# Patient Record
Sex: Female | Born: 1980 | Race: Black or African American | Hispanic: No | Marital: Married | Smoking: Never smoker
Health system: Southern US, Community
[De-identification: ages and names within clinical notes are randomized; demographics above are authoritative.]

## PROBLEM LIST (undated history)

## (undated) HISTORY — PX: NO PAST SURGERIES: SHX2092

---

## 2019-08-04 ENCOUNTER — Other Ambulatory Visit: Payer: Self-pay

## 2019-08-04 ENCOUNTER — Encounter (HOSPITAL_COMMUNITY): Payer: Self-pay

## 2019-08-04 ENCOUNTER — Ambulatory Visit (HOSPITAL_COMMUNITY)
Admission: EM | Admit: 2019-08-04 | Discharge: 2019-08-04 | Disposition: A | Discharge status: Home / Self Care | Payer: PRIVATE HEALTH INSURANCE | Attending: Family Medicine | Admitting: Family Medicine

## 2019-08-04 DIAGNOSIS — N912 Amenorrhea, unspecified: Secondary | ICD-10-CM | POA: Diagnosis not present

## 2019-08-04 DIAGNOSIS — Z3202 Encounter for pregnancy test, result negative: Secondary | ICD-10-CM | POA: Diagnosis not present

## 2019-08-04 LAB — POCT PREGNANCY, URINE: Preg Test, Ur: NEGATIVE

## 2019-08-04 NOTE — ED Provider Notes (Signed)
[facility]    CSN: [id number] Arrival date & time: 08/04/19  1122      History   Chief Complaint Chief Complaint  Patient presents with  . Possible Pregnancy    HPI Crystal Hardy is a 38 y.o. female.   Crystal Hardy presents with complaints of no period since April 06 2019. She has tested for pregnancy which has been negative. She states she has occasionally missed a month or two of her period but has never gone this long. She is not on birth control. Denies any previous pregnancies. States she has not recently been sexually active, she is concerned about pregnancy, however. No abdominal pain, no back pain, no nausea or vomiting.     ROS per HPI, negative if not otherwise mentioned.      History reviewed. No pertinent past medical history.  There are no active problems to display for this patient.   History reviewed. No pertinent surgical history.  OB History   No obstetric history on file.      Home Medications    Prior to Admission medications   Not on File    Family History Family History  Problem Relation Age of Onset  . Healthy Mother   . Healthy Father     Social History Social History   Tobacco Use  . Smoking status: Never Smoker  . Smokeless tobacco: Never Used  Substance Use Topics  . Alcohol use: Never    Frequency: Never  . Drug use: Not on file     Allergies   Patient has no known allergies.   Review of Systems Review of Systems   Physical Exam Triage Vital Signs ED Triage Vitals [08/04/19 1245]  Enc Vitals Group     BP 123/84     Pulse Rate 71     Resp 16     Temp 97.7 F (36.5 C)     Temp Source Oral     SpO2 100 %     Weight      Height      Head Circumference      Peak Flow      Pain Score 0     Pain Loc      Pain Edu?      Excl. in GC?    No data found.  Updated Vital Signs BP 123/84 (BP Location: Right Arm)   Pulse 71   Temp 97.7 F (36.5 C) (Oral)   Resp 16   LMP 04/06/2019  (Exact Date)   SpO2 100%    Physical Exam Constitutional:      General: She is not in acute distress.    Appearance: She is well-developed.  Cardiovascular:     Rate and Rhythm: Normal rate.  Pulmonary:     Effort: Pulmonary effort is normal.  Abdominal:     Tenderness: There is no abdominal tenderness.  Skin:    General: Skin is warm and dry.  Neurological:     Mental Status: She is alert and oriented to person, place, and time.      UC Treatments / Results  Labs (all labs ordered are listed, but only abnormal results are displayed) Labs Reviewed  POC URINE PREG, ED  POCT PREGNANCY, URINE    EKG   Radiology No results found.  Procedures Procedures (including critical care time)  Medications Ordered in UC Medications - No data to display  Initial Impression / Assessment and Plan / UC Course  I have reviewed the  triage vital signs and the nursing notes.  Pertinent labs & imaging results that were available during my care of the patient were reviewed by me and considered in my medical decision making (see chart for details).     Negative pregnancy test by urine here today as well. Otherwise no symptoms. Recommended gynecology follow up. Patient verbalized understanding and agreeable to plan.   Final Clinical Impressions(s) / UC Diagnoses   Final diagnoses:  Amenorrhea  Negative pregnancy test     Discharge Instructions     Negative for pregnancy here today as well.  I would like you to follow up with gynecology for further management and evaluation of your lack of periods.    ED Prescriptions    None     PDMP not reviewed this encounter.   Zigmund Gottron, NP 08/04/19 1320

## 2019-08-04 NOTE — ED Triage Notes (Signed)
Patient presents to Urgent Care with complaints of missing period since June 28. Patient reports she has taken pregnancy tests at home and they have been negative, pt denies implant or IUD.

## 2019-08-04 NOTE — Discharge Instructions (Signed)
Negative for pregnancy here today as well.  I would like you to follow up with gynecology for further management and evaluation of your lack of periods.

## 2019-08-25 ENCOUNTER — Telehealth: Payer: Self-pay | Admitting: Student

## 2019-08-25 NOTE — Telephone Encounter (Signed)
Spoke to patient about her appointment on 11/17 @ 9:15. Patient instructed to wear a face mask for the entire appointment and no visitors are allowed with her during the visit. Patient screened for covid symptoms and denied having any

## 2019-08-26 ENCOUNTER — Encounter: Payer: PRIVATE HEALTH INSURANCE | Admitting: Student

## 2019-09-08 ENCOUNTER — Ambulatory Visit: Payer: PRIVATE HEALTH INSURANCE | Admitting: Advanced Practice Midwife

## 2019-09-08 ENCOUNTER — Other Ambulatory Visit: Payer: Self-pay

## 2019-09-08 ENCOUNTER — Encounter: Payer: Self-pay | Admitting: Advanced Practice Midwife

## 2019-09-08 VITALS — BP 124/86 | HR 69 | Wt 176.1 lb

## 2019-09-08 DIAGNOSIS — N912 Amenorrhea, unspecified: Secondary | ICD-10-CM

## 2019-09-08 NOTE — Progress Notes (Signed)
Patient was screened prior to today's visit then  reported cough and sinus congestion when she was roomed for today's visit. She states her husband has the same symptoms, no COVID tests in work. COVID screening policy was reviewed with patient and she was asked to present to [facility] for testing as well as reschedule her appointment. Patient verbalized understanding.  Mallie Snooks, MSN, CNM Certified Nurse Midwife, [facility] for [employer], [place] 09/08/19 10:39 AM

## 2019-09-08 NOTE — Patient Instructions (Signed)
COVID-19: How to Protect Yourself and Others Know how it spreads  There is currently no vaccine to prevent coronavirus disease 2019 (COVID-19).  The best way to prevent illness is to avoid being exposed to this virus.  The virus is thought to spread mainly from person-to-person. ? Between people who are in close contact with one another (within about 6 feet). ? Through respiratory droplets produced when an infected person coughs, sneezes or talks. ? These droplets can land in the mouths or noses of people who are nearby or possibly be inhaled into the lungs. ? Some recent studies have suggested that COVID-19 may be spread by people who are not showing symptoms. Everyone should Clean your hands often  Wash your hands often with soap and water for at least 20 seconds especially after you have been in a public place, or after blowing your nose, coughing, or sneezing.  If soap and water are not readily available, use a hand sanitizer that contains at least 60% alcohol. Cover all surfaces of your hands and rub them together until they feel dry.  Avoid touching your eyes, nose, and mouth with unwashed hands. Avoid close contact  Stay home if you are sick.  Avoid close contact with people who are sick.  Put distance between yourself and other people. ? Remember that some people without symptoms may be able to spread virus. ? This is especially important for people who are at higher risk of getting very sick.www.cdc.gov/coronavirus/2019-ncov/need-extra-precautions/people-at-higher-risk.html Cover your mouth and nose with a cloth face cover when around others  You could spread COVID-19 to others even if you do not feel sick.  Everyone should wear a cloth face cover when they have to go out in public, for example to the grocery store or to pick up other necessities. ? Cloth face coverings should not be placed on young children under age 2, anyone who has trouble breathing, or is unconscious,  incapacitated or otherwise unable to remove the mask without assistance.  The cloth face cover is meant to protect other people in case you are infected.  Do NOT use a facemask meant for a healthcare worker.  Continue to keep about 6 feet between yourself and others. The cloth face cover is not a substitute for social distancing. Cover coughs and sneezes  If you are in a private setting and do not have on your cloth face covering, remember to always cover your mouth and nose with a tissue when you cough or sneeze or use the inside of your elbow.  Throw used tissues in the trash.  Immediately wash your hands with soap and water for at least 20 seconds. If soap and water are not readily available, clean your hands with a hand sanitizer that contains at least 60% alcohol. Clean and disinfect  Clean AND disinfect frequently touched surfaces daily. This includes tables, doorknobs, light switches, countertops, handles, desks, phones, keyboards, toilets, faucets, and sinks. www.cdc.gov/coronavirus/2019-ncov/prevent-getting-sick/disinfecting-your-home.html  If surfaces are dirty, clean them: Use detergent or soap and water prior to disinfection.  Then, use a household disinfectant. You can see a list of EPA-registered household disinfectants here. cdc.gov/coronavirus 02/11/2019 This information is not intended to replace advice given to you by your health care provider. Make sure you discuss any questions you have with your health care provider. Document Released: 01/21/2019 Document Revised: 02/19/2019 Document Reviewed: 01/21/2019 Elsevier Patient Education  2020 Elsevier Inc.  

## 2019-09-23 ENCOUNTER — Other Ambulatory Visit: Payer: Self-pay

## 2019-09-23 ENCOUNTER — Encounter: Payer: Self-pay | Admitting: Family Medicine

## 2019-09-23 ENCOUNTER — Ambulatory Visit (INDEPENDENT_AMBULATORY_CARE_PROVIDER_SITE_OTHER): Payer: PRIVATE HEALTH INSURANCE | Admitting: Family Medicine

## 2019-09-23 VITALS — BP 129/86 | HR 81 | Wt 181.0 lb

## 2019-09-23 DIAGNOSIS — Z113 Encounter for screening for infections with a predominantly sexual mode of transmission: Secondary | ICD-10-CM

## 2019-09-23 DIAGNOSIS — Z3169 Encounter for other general counseling and advice on procreation: Secondary | ICD-10-CM | POA: Insufficient documentation

## 2019-09-23 DIAGNOSIS — Z124 Encounter for screening for malignant neoplasm of cervix: Secondary | ICD-10-CM | POA: Diagnosis not present

## 2019-09-23 DIAGNOSIS — Z1151 Encounter for screening for human papillomavirus (HPV): Secondary | ICD-10-CM

## 2019-09-23 DIAGNOSIS — Z01419 Encounter for gynecological examination (general) (routine) without abnormal findings: Secondary | ICD-10-CM

## 2019-09-23 DIAGNOSIS — N911 Secondary amenorrhea: Secondary | ICD-10-CM

## 2019-09-23 MED ORDER — MEDROXYPROGESTERONE ACETATE 10 MG PO TABS: 10.0000 mg | ORAL_TABLET | Freq: Every day | ORAL | 3 refills | Status: DC

## 2019-09-23 NOTE — Progress Notes (Signed)
Subjective:    Patient ID: Crystal Hardy is a 37 y.o. female presenting with Gynecologic Exam  on 09/23/2019  HPI: Normal cycles until June--no cycle since end of Jun. Saw a spot on November.denies hot flashes, night sweats. No change in weight, cold intolerance or swelling in her feet. Has h/o skipping a cycle for one or two months only and ten it has come back.  Review of Systems  Constitutional: Negative for chills, fever and unexpected weight change.  HENT: Negative for congestion, nosebleeds and rhinorrhea.   Eyes: Negative for visual disturbance.  Respiratory: Negative for chest tightness and shortness of breath.   Cardiovascular: Negative for chest pain.  Gastrointestinal: Negative for abdominal distention, abdominal pain, blood in stool, constipation, diarrhea, nausea and vomiting.  Genitourinary: Negative for dyspareunia, dysuria, frequency, menstrual problem and vaginal bleeding.  Musculoskeletal: Negative for arthralgias and joint swelling.       No edema   Skin: Negative for rash.       No hair growth  Neurological: Negative for dizziness and headaches.  Psychiatric/Behavioral: Negative for behavioral problems, dysphoric mood and sleep disturbance.  All other systems reviewed and are negative.     Objective:    BP 129/86   Pulse 81   Wt 181 lb (82.1 kg)   LMP 04/05/2019 (Exact Date)  Physical Exam Vitals reviewed. Exam conducted with a chaperone present.  Constitutional:      Appearance: She is well-developed.  HENT:     Head: Normocephalic and atraumatic.  Eyes:     General: No scleral icterus.    Pupils: Pupils are equal, round, and reactive to light.  Neck:     Thyroid: No thyromegaly.  Cardiovascular:     Rate and Rhythm: Normal rate and regular rhythm.  Pulmonary:     Effort: Pulmonary effort is normal.     Breath sounds: Normal breath sounds.  Chest:     Breasts:        Right: Normal. No inverted nipple or mass.        Left: Normal. No  inverted nipple or mass.  Abdominal:     General: There is no distension.     Palpations: Abdomen is soft.     Tenderness: There is no abdominal tenderness.  Genitourinary:    Tanner stage (genital): 5.     Comments: BUS normal, vagina is pink and rugated, cervix is nulliparous without lesion, uterus is small and anteverted, no adnexal mass or tenderness.  Musculoskeletal:     Cervical back: Normal range of motion.  Skin:    General: Skin is warm and dry.  Neurological:     Mental Status: She is alert and oriented to person, place, and time.         Assessment & Plan:   Problem List Items Addressed This Visit      Unprioritized   Secondary amenorrhea    Check labs, and work up including u/s      Relevant Medications   medroxyPROGESTERone (PROVERA) 10 MG tablet   Other Relevant Orders   FSH   TSH   Prolactin   17-Hydroxyprogesterone   Testosterone levels*LC   US PELVIC COMPLETE WITH TRANSVAGINAL   Infertility counseling    May be related to her anovulation, but complete w/u to include HSG and semen analysis      Relevant Orders   Hysterosalpingogram (HSG) for infertility    Other Visit Diagnoses    Well woman exam    -  Primary  Relevant Orders   Cytology - PAP( [place])   CBC   Comprehensive metabolic panel   Hemoglobin A1c   Vitamin D (25 hydroxy)   Lipid panel   Screen for STD (sexually transmitted disease)       Relevant Orders   Hepatitis B surface antigen   Hepatitis C antibody   RPR   HIV antibody      Total face-to-face time with patient: 30 minutes. Over 50% of encounter was spent on counseling and coordination of care. Return in about 4 weeks (around 10/21/2019) for virtual, schedule HSG on day 5 of cycle-pt. to call when gets menses, sched pelvic sono, semen anal.  Crystal Hardy 09/23/2019 10:00 AM

## 2019-09-23 NOTE — Assessment & Plan Note (Signed)
May be related to her anovulation, but complete w/u to include HSG and semen analysis

## 2019-09-23 NOTE — Assessment & Plan Note (Signed)
Check labs, and work up including u/s

## 2019-09-23 NOTE — Progress Notes (Signed)
[facility] to inquire about semen analysis process- left message for staff number to call us back. Provided office info to patient.

## 2019-09-23 NOTE — Patient Instructions (Signed)

## 2019-09-24 LAB — CYTOLOGY - PAP
Chlamydia: NEGATIVE
Comment: NEGATIVE
Comment: NEGATIVE
Comment: NEGATIVE
Comment: NORMAL
Diagnosis: NEGATIVE
High risk HPV: NEGATIVE
Neisseria Gonorrhea: NEGATIVE
Trichomonas: NEGATIVE

## 2019-09-28 LAB — TSH: TSH: 2.08 u[IU]/mL (ref 0.450–4.500)

## 2019-09-28 LAB — LIPID PANEL
Chol/HDL Ratio: 2.7 ratio (ref 0.0–4.4)
Cholesterol, Total: 139 mg/dL (ref 100–199)
HDL: 52 mg/dL (ref >39)
LDL Chol Calc (NIH): 74 mg/dL (ref 0–99)
Triglycerides: 64 mg/dL (ref 0–149)
VLDL Cholesterol Cal: 13 mg/dL (ref 5–40)

## 2019-09-28 LAB — CBC
Hematocrit: 37.5 % (ref 34.0–46.6)
Hemoglobin: 12.2 g/dL (ref 11.1–15.9)
MCH: 26.3 pg — ABNORMAL LOW (ref 26.6–33.0)
MCHC: 32.5 g/dL (ref 31.5–35.7)
MCV: 81 fL (ref 79–97)
Platelets: 180 10*3/uL (ref 150–450)
RBC: 4.64 x10E6/uL (ref 3.77–5.28)
RDW: 13.2 % (ref 11.7–15.4)
WBC: 7.8 10*3/uL (ref 3.4–10.8)

## 2019-09-28 LAB — COMPREHENSIVE METABOLIC PANEL
ALT: 11 IU/L (ref 0–32)
AST: 15 IU/L (ref 0–40)
Albumin/Globulin Ratio: 1.3 (ref 1.2–2.2)
Albumin: 4.1 g/dL (ref 3.8–4.8)
Alkaline Phosphatase: 65 IU/L (ref 39–117)
BUN/Creatinine Ratio: 23 (ref 9–23)
BUN: 19 mg/dL (ref 6–20)
Bilirubin Total: 0.2 mg/dL (ref 0.0–1.2)
CO2: 24 mmol/L (ref 20–29)
Calcium: 10.1 mg/dL (ref 8.7–10.2)
Chloride: 103 mmol/L (ref 96–106)
Creatinine, Ser: 0.84 mg/dL (ref 0.57–1.00)
GFR calc Af Amer: 101 mL/min/{1.73_m2} (ref >59)
GFR calc non Af Amer: 88 mL/min/{1.73_m2} (ref >59)
Globulin, Total: 3.1 g/dL (ref 1.5–4.5)
Glucose: 91 mg/dL (ref 65–99)
Potassium: 4.6 mmol/L (ref 3.5–5.2)
Sodium: 139 mmol/L (ref 134–144)
Total Protein: 7.2 g/dL (ref 6.0–8.5)

## 2019-09-28 LAB — VITAMIN D 25 HYDROXY (VIT D DEFICIENCY, FRACTURES): Vit D, 25-Hydroxy: 36.8 ng/mL (ref 30.0–100.0)

## 2019-09-28 LAB — PROLACTIN: Prolactin: 23.3 ng/mL (ref 4.8–23.3)

## 2019-09-28 LAB — TESTOSTERONE, FREE, TOTAL, SHBG
Sex Hormone Binding: 51.8 nmol/L (ref 24.6–122.0)
Testosterone, Free: 0.6 pg/mL (ref 0.0–4.2)
Testosterone: <3 ng/dL — ABNORMAL LOW (ref 8–48)

## 2019-09-28 LAB — RPR: RPR Ser Ql: NONREACTIVE

## 2019-09-28 LAB — HEPATITIS B SURFACE ANTIGEN: Hepatitis B Surface Ag: NEGATIVE

## 2019-09-28 LAB — HEPATITIS C ANTIBODY: Hep C Virus Ab: <0.1 s/co ratio (ref 0.0–0.9)

## 2019-09-28 LAB — HIV ANTIBODY (ROUTINE TESTING W REFLEX): HIV Screen 4th Generation wRfx: NONREACTIVE

## 2019-09-28 LAB — FOLLICLE STIMULATING HORMONE: FSH: 76.8 m[IU]/mL

## 2019-09-28 LAB — HEMOGLOBIN A1C
Est. average glucose Bld gHb Est-mCnc: 111 mg/dL
Hgb A1c MFr Bld: 5.5 % (ref 4.8–5.6)

## 2019-09-28 LAB — 17-HYDROXYPROGESTERONE: 17-Hydroxyprogesterone: 17 ng/dL

## 2019-09-29 ENCOUNTER — Telehealth: Payer: Self-pay

## 2019-09-29 NOTE — Telephone Encounter (Signed)
Called [facility] to schedule appt for pt to have an HSG. Representative stated she already talked to pt , procedure can only be done the 1st day of her cycle & they have nothing available for Dec.Pt has to waite until January.

## 2019-09-29 NOTE — Telephone Encounter (Signed)
-----   Message from [name], MD sent at 09/29/2019  7:45 AM EST ----- Appears she is menopausal based on labs. Other labs ok.

## 2019-09-30 NOTE — Telephone Encounter (Signed)
Called patient with [place] interpreter [phone] answer. Left message to call us back for results.

## 2019-10-01 ENCOUNTER — Encounter: Payer: Self-pay | Admitting: Lactation Services

## 2019-10-01 NOTE — Telephone Encounter (Addendum)
Called pt with assistance of [place] Interpreter to give her lab results. Pt did not answer. LM for pt to call the office at her earliest convenience. Letter created and sent to patient.

## 2019-10-08 ENCOUNTER — Ambulatory Visit (HOSPITAL_COMMUNITY): Payer: PRIVATE HEALTH INSURANCE

## 2019-10-14 ENCOUNTER — Ambulatory Visit (HOSPITAL_COMMUNITY): Admission: RE | Admit: 2019-10-14 | Payer: PRIVATE HEALTH INSURANCE | Source: Ambulatory Visit

## 2019-10-20 ENCOUNTER — Ambulatory Visit (HOSPITAL_COMMUNITY)
Admission: RE | Admit: 2019-10-20 | Discharge: 2019-10-20 | Disposition: A | Discharge status: Home / Self Care | Payer: 59 | Source: Ambulatory Visit | Attending: Family Medicine | Admitting: Family Medicine

## 2019-10-20 ENCOUNTER — Other Ambulatory Visit: Payer: Self-pay

## 2019-10-20 DIAGNOSIS — N911 Secondary amenorrhea: Secondary | ICD-10-CM | POA: Insufficient documentation

## 2019-10-30 ENCOUNTER — Encounter: Payer: Self-pay | Admitting: Family Medicine

## 2019-10-30 ENCOUNTER — Telehealth (INDEPENDENT_AMBULATORY_CARE_PROVIDER_SITE_OTHER): Payer: PRIVATE HEALTH INSURANCE | Admitting: Family Medicine

## 2019-10-30 ENCOUNTER — Other Ambulatory Visit: Payer: Self-pay

## 2019-10-30 DIAGNOSIS — N911 Secondary amenorrhea: Secondary | ICD-10-CM

## 2019-10-30 DIAGNOSIS — Z3169 Encounter for other general counseling and advice on procreation: Secondary | ICD-10-CM | POA: Diagnosis not present

## 2019-10-30 NOTE — Progress Notes (Signed)
TELEHEALTH VIRTUAL GYNECOLOGY VISIT ENCOUNTER NOTE  I connected with Crystal Hardy on 10/30/19 at  1:55 PM EST by telephone at home and verified that I am speaking with the correct person using two identifiers.   I discussed the limitations, risks, security and privacy concerns of performing an evaluation and management service by telephone and the availability of in person appointments. I also discussed with the patient that there may be a patient responsible charge related to this service. The patient expressed understanding and agreed to proceed.   History:  Crystal Hardy is a 39 y.o. G0P0000 female being evaluated today for secondary amenorrhea. Labs reveal she has gone through menopause.. She denies any abnormal vaginal discharge, bleeding, pelvic pain or other concerns.     The following portions of the patient's history were reviewed and updated as appropriate: allergies, current medications, past family history, past medical history, past social history, past surgical history and problem list.   Health Maintenance:  Normal pap and negative HRHPV on 09/23/2019.    Review of Systems:  Pertinent items noted in HPI and remainder of comprehensive ROS otherwise negative.  Physical Exam:   General:  Alert, oriented and cooperative.   Mental Status: Normal mood and affect perceived. Normal judgment and thought content.  Physical exam deferred due to nature of the encounter  Labs and Imaging No results found for this or any previous visit (from the past 336 hour(s)). US PELVIC COMPLETE WITH TRANSVAGINAL  Result Date: 10/20/2019 CLINICAL DATA:  Secondary amenorrhea EXAM: TRANSABDOMINAL AND TRANSVAGINAL ULTRASOUND OF PELVIS TECHNIQUE: Both transabdominal and transvaginal ultrasound examinations of the pelvis were performed. Transabdominal technique was performed for global imaging of the pelvis including uterus, ovaries, adnexal regions, and pelvic cul-de-sac. It was necessary to  proceed with endovaginal exam following the transabdominal exam to visualize the uterus, endometrium, and ovaries. COMPARISON:  None FINDINGS: Uterus Measurements: 7.8 x 4.9 x 7.0 cm = volume: 140 mL. Retroverted and minimally retroflexed. Intramural leiomyoma at anterior mid uterus 2.5 x 1.7 x 2.1 cm. Anterior LEFT intramural leiomyoma at upper uterine segment 3.5 x 3.0 x 2.2 cm. No additional masses. Endometrium Thickness: 4 mm.  No endometrial fluid or focal abnormality Right ovary Measurements: 2.4 x 1.1 x 1.4 cm = volume: 1.9 mL. Normal morphology without mass Left ovary Measurements: 2.7 x 1.8 x 1.7 cm = volume: 4.2 mL. Dominant follicle without mass Other findings Small amount of nonspecific free pelvic fluid.  No adnexal masses. IMPRESSION: 2 uterine leiomyomata as above. Small amount of nonspecific endometrial fluid without adnexal mass. Electronically Signed   By: Ulyses Southward M.D.   On: 10/20/2019 11:40      Assessment and Plan:   Problem List Items Addressed This Visit      Unprioritized   Secondary amenorrhea    Advised should no longer have bleeding, if returns, return to see [place].      Infertility counseling    Given post-menopausal status, unlikely to conceive, conveyed this to patient.          I discussed the assessment and treatment plan with the patient. The patient was provided an opportunity to ask questions and all were answered. The patient agreed with the plan and demonstrated an understanding of the instructions.   The patient was advised to call back or seek an in-person evaluation/go to the ED if the symptoms worsen or if the condition fails to improve as anticipated.  I provided 8 minutes of non-face-to-face time during this  encounter.   [name], MD [facility] for [employer], [place]

## 2019-10-30 NOTE — Assessment & Plan Note (Signed)
Given post-menopausal status, unlikely to conceive, conveyed this to patient.

## 2019-10-30 NOTE — Assessment & Plan Note (Signed)
Advised should no longer have bleeding, if returns, return to see [place].

## 2019-10-30 NOTE — Progress Notes (Signed)
I connected with  Crystal Hardy on 10/30/19 at  1:55 PM EST by telephone and verified that I am speaking with the correct person using two identifiers.   I discussed the limitations, risks, security and privacy concerns of performing an evaluation and management service by telephone and the availability of in person appointments. I also discussed with the patient that there may be a patient responsible charge related to this service. The patient expressed understanding and agreed to proceed.  [name], CMA 10/30/2019  1:47 PM

## 2019-12-02 ENCOUNTER — Telehealth: Payer: Self-pay | Admitting: Family Medicine

## 2019-12-02 NOTE — Telephone Encounter (Signed)
Patient called to say her period came, and she needed to let Dr Shawnie Pons know.

## 2020-02-22 ENCOUNTER — Emergency Department (HOSPITAL_COMMUNITY)
Admission: EM | Admit: 2020-02-22 | Discharge: 2020-02-22 | Disposition: A | Discharge status: Home / Self Care | Payer: 59 | Attending: Emergency Medicine | Admitting: Emergency Medicine

## 2020-02-22 ENCOUNTER — Other Ambulatory Visit: Payer: Self-pay

## 2020-02-22 ENCOUNTER — Encounter (HOSPITAL_COMMUNITY): Payer: Self-pay | Admitting: Emergency Medicine

## 2020-02-22 DIAGNOSIS — W260XXA Contact with knife, initial encounter: Secondary | ICD-10-CM | POA: Insufficient documentation

## 2020-02-22 DIAGNOSIS — Y929 Unspecified place or not applicable: Secondary | ICD-10-CM | POA: Insufficient documentation

## 2020-02-22 DIAGNOSIS — S61412A Laceration without foreign body of left hand, initial encounter: Secondary | ICD-10-CM | POA: Insufficient documentation

## 2020-02-22 DIAGNOSIS — Y9389 Activity, other specified: Secondary | ICD-10-CM | POA: Insufficient documentation

## 2020-02-22 DIAGNOSIS — Y998 Other external cause status: Secondary | ICD-10-CM | POA: Insufficient documentation

## 2020-02-22 MED ORDER — LIDOCAINE HCL (PF) 1 % IJ SOLN
5.0000 mL | Freq: Once | INTRAMUSCULAR | Status: AC
  Administered 2020-02-22: 5 mL
  Filled 2020-02-22: qty 30

## 2020-02-22 NOTE — ED Triage Notes (Addendum)
Pt presents with laceration to left hand. Bleeding controlled at this time. Pt reports last tetanus shot 03/2019

## 2020-02-22 NOTE — Discharge Instructions (Addendum)
Suture removal in 8 days  °

## 2020-02-22 NOTE — ED Notes (Signed)
Patient verbalizes understanding of discharge instructions. Opportunity for questioning and answers were provided. Armband removed by staff, pt discharged from ED ambulatory.   

## 2020-02-22 NOTE — ED Provider Notes (Signed)
[facility] Provider Note   CSN: [id number] Arrival date & time: 02/22/20  1801     History Chief Complaint  Patient presents with  . Laceration    Crystal Hardy is a 39 y.o. female.  The history is provided by the patient. No language interpreter was used.  Laceration Location:  Hand Hand laceration location:  L hand Length:  1.2 Depth:  Through underlying tissue Quality: straight   Bleeding: controlled   Laceration mechanism:  Knife Pain details:    Quality:  Aching   Severity:  Mild   Timing:  Constant Foreign body present:  No foreign bodies Relieved by:  Nothing Tetanus status:  Up to date  Pt cut hand with a knife at work    History reviewed. No pertinent past medical history.  Patient Active Problem List   Diagnosis Date Noted  . 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 Infertility counseling 09/23/2019    History reviewed. No pertinent surgical history.   OB History    Gravida  0   Para  0   Term  0   Preterm  0   AB  0   Living  0     SAB  0   TAB  0   Ectopic  0   Multiple  0   Live Births  0           Family History  Problem Relation Age of Onset  . Healthy Mother   . Diabetes Father     Social History   Tobacco Use  . Smoking status: Never Smoker  . Smokeless tobacco: Never Used  Substance Use Topics  . Alcohol use: Never  . Drug use: Never    Home Medications Prior to Admission medications   Medication Sig Start Date End Date Taking? Authorizing Provider  medroxyPROGESTERone (PROVERA) 10 MG tablet Take 1 tablet (10 mg total) by mouth daily. 09/23/19   [name], MD    Allergies    Patient has no known allergies.  Review of Systems   Review of Systems  Skin: Positive for wound.  All other systems reviewed and are negative.   Physical Exam Updated Vital Signs BP 120/88   Pulse 75   Temp 97.9 F (36.6 C) (Oral)   Resp 16   SpO2 100%   Physical Exam  Vitals reviewed.  Constitutional:      Appearance: Normal appearance.  Cardiovascular:     Rate and Rhythm: Normal rate.  Pulmonary:     Effort: Pulmonary effort is normal.  Musculoskeletal:        General: Signs of injury present.     Comments: 1.2 cm laceration left hand  Neurological:     General: No focal deficit present.     Mental Status: She is alert.  Psychiatric:        Mood and Affect: Mood normal.     ED Results / Procedures / Treatments   Labs (all labs ordered are listed, but only abnormal results are displayed) Labs Reviewed - No data to display  EKG None  Radiology No results found.  Procedures .Marland KitchenLaceration Repair  Date/Time: 02/22/2020 8:34 PM Performed by: Fransico Meadow, PA-C Authorized by: Fransico Meadow, PA-C   Consent:    Consent obtained:  Verbal   Consent given by:  Patient   Risks discussed:  Infection   Alternatives discussed:  No treatment Anesthesia (see MAR for exact dosages):    Anesthesia method:  None Laceration details:    Location:  Hand   Hand location:  L palm   Length (cm):  1.2 Repair type:    Repair type:  Simple Exploration:    Wound exploration: wound explored through full range of motion     Contaminated: no   Treatment:    Area cleansed with:  Betadine   Amount of cleaning:  Standard   Irrigation solution:  Sterile saline   Irrigation method:  Syringe Skin repair:    Repair method:  Sutures   Suture size:  5-0   Suture material:  Prolene   Suture technique:  Simple interrupted   Number of sutures:  3 Approximation:    Approximation:  Close Post-procedure details:    Dressing:  Sterile dressing and adhesive bandage   Patient tolerance of procedure:  Tolerated well, no immediate complications   (including critical care time)  Medications Ordered in ED Medications  lidocaine (PF) (XYLOCAINE) 1 % injection 5 mL (5 mLs Infiltration Given 02/22/20 2013)    ED Course  I have reviewed the triage vital  signs and the nursing notes.  Pertinent labs & imaging results that were available during my care of the patient were reviewed by me and considered in my medical decision making (see chart for details).    MDM Rules/Calculators/A&P                      MDM  Pt advised suture removal in 8 days  Final Clinical Impression(s) / ED Diagnoses Final diagnoses:  Laceration of left hand, foreign body presence unspecified, initial encounter    Rx / DC Orders ED Discharge Orders    None       Osie Cheeks 02/22/20 2035    Terald Sleeper, MD 02/22/20 [phone]

## 2020-04-05 ENCOUNTER — Ambulatory Visit (HOSPITAL_COMMUNITY)
Admission: EM | Admit: 2020-04-05 | Discharge: 2020-04-05 | Disposition: A | Discharge status: Home / Self Care | Payer: PRIVATE HEALTH INSURANCE | Attending: Internal Medicine | Admitting: Internal Medicine

## 2020-04-05 ENCOUNTER — Other Ambulatory Visit: Payer: Self-pay

## 2020-04-05 DIAGNOSIS — N644 Mastodynia: Secondary | ICD-10-CM

## 2020-04-05 MED ORDER — IBUPROFEN 600 MG PO TABS: 600.0000 mg | ORAL_TABLET | Freq: Four times a day (QID) | ORAL | 0 refills | Status: DC | PRN

## 2020-04-05 NOTE — ED Triage Notes (Signed)
Pt c/o pain to left breast for approx 2 weeks; pt states pain increases/reproducible with palpation. Pt also c/o pain to upper back, but states h/o same pain. Denies changes to breast/nipple.  Denies SOB, sternal pain, or radiating pain to left shoulder/arm, weakness, diaphoresis, n/v or dizziness.

## 2020-04-05 NOTE — ED Provider Notes (Signed)
Crystal Hardy    CSN: [id number] Arrival date & time: 04/05/20  0102      History   Chief Complaint Chief Complaint  Patient presents with  . Breast Pain    HPI Crystal Hardy is a 39 y.o. female comes to the urgent care with complaints of left breast pain of 2 weeks duration.  Symptoms started 2 weeks ago and has been persistent.  Patient denies any nipple discharge.  She does not examine her breasts regularly.  No numbness or tingling.  No skin changes.  No family history of breast disease.   No weight loss. HPI  No past medical history on file.  Patient Active Problem List   Diagnosis Date Noted  . 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 Infertility counseling 09/23/2019    No past surgical history on file.  OB History    Gravida  0   Para  0   Term  0   Preterm  0   AB  0   Living  0     SAB  0   TAB  0   Ectopic  0   Multiple  0   Live Births  0            Home Medications    Prior to Admission medications   Medication Sig Start Date End Date Taking? Authorizing Provider  ibuprofen (ADVIL) 600 MG tablet Take 1 tablet (600 mg total) by mouth every 6 (six) hours as needed. 04/05/20   Crystal Hardy, Myrene Galas, MD  medroxyPROGESTERone (PROVERA) 10 MG tablet Take 1 tablet (10 mg total) by mouth daily. 09/23/19   Crystal Jude, MD    Family History Family History  Problem Relation Age of Onset  . Healthy Mother   . Diabetes Father     Social History Social History   Tobacco Use  . Smoking status: Never Smoker  . Smokeless tobacco: Never Used  Vaping Use  . Vaping Use: Never used  Substance Use Topics  . Alcohol use: Never  . Drug use: Never     Allergies   Patient has no known allergies.   Review of Systems Review of Systems  Constitutional: Negative.   Respiratory: Negative.   Gastrointestinal: Negative.   Genitourinary: Negative.      Physical Exam Triage Vital Signs ED Triage Vitals [04/05/20 0957]  Enc Vitals  Group     BP 109/78     Pulse Rate 67     Resp 18     Temp 98.1 F (36.7 C)     Temp Source Oral     SpO2 100 %     Weight      Height      Head Circumference      Peak Flow      Pain Score      Pain Loc      Pain Edu?      Excl. in [place]?    No data found.  Updated Vital Signs BP 109/78 (BP Location: Right Arm)   Pulse 67   Temp 98.1 F (36.7 C) (Oral)   Resp 18   SpO2 100%   Visual Acuity Right Eye Distance:   Left Eye Distance:   Bilateral Distance:    Right Eye Near:   Left Eye Near:    Bilateral Near:     Physical Exam Vitals and nursing note reviewed. Exam conducted with a chaperone present.  HENT:     Right Ear: Tympanic membrane  normal.     Left Ear: Tympanic membrane normal.  Cardiovascular:     Pulses: Normal pulses.     Heart sounds: Normal heart sounds.  Pulmonary:     Effort: Pulmonary effort is normal.     Breath sounds: Normal breath sounds.  Abdominal:     General: Bowel sounds are normal.     Palpations: Abdomen is soft.  Musculoskeletal:     Comments: Both breasts are symmetrical in size.  Tenderness on palpation of the left breast.  No obvious masses palpated.  No changes in the skin.  No nipple deviation bilaterally.  No erythema.  No other skin changes.  Neurological:     Mental Status: She is alert.      UC Treatments / Results  Labs (all labs ordered are listed, but only abnormal results are displayed) Labs Reviewed - No data to display  EKG   Radiology No results found.  Procedures Procedures (including critical care time)  Medications Ordered in UC Medications - No data to display  Initial Impression / Assessment and Plan / UC Course  I have reviewed the triage vital signs and the nursing notes.  Pertinent labs & imaging results that were available during my care of the patient were reviewed by me and considered in my medical decision making (see chart for details).     1.  Left breast pain: Ibuprofen as needed  for pain Patient is advised to get a mammogram. I called the breast center of [facility] to schedule a diagnostic mammogram for the patient.  Patient is made aware of the follow-up appointment Given information to establish primary care services as well.   Final Clinical Impressions(s) / UC Diagnoses   Final diagnoses:  Breast pain, left   Discharge Instructions   None    ED Prescriptions    Medication Sig Dispense Auth. Provider   ibuprofen (ADVIL) 600 MG tablet Take 1 tablet (600 mg total) by mouth every 6 (six) hours as needed. 30 tablet Crystal Hardy, Crystal Mccreedy, MD     PDMP not reviewed this encounter.   Crystal Jansky, MD 04/05/20 1220

## 2020-04-20 ENCOUNTER — Encounter: Payer: PRIVATE HEALTH INSURANCE | Admitting: Student

## 2020-04-20 ENCOUNTER — Telehealth: Payer: Self-pay | Admitting: *Deleted

## 2020-04-20 NOTE — Telephone Encounter (Signed)
Thank you :)

## 2020-04-20 NOTE — Telephone Encounter (Signed)
Pt did not come her apt this am. Called pt to re-schedule - no answer; left message on vm to our office.

## 2020-04-22 ENCOUNTER — Encounter: Payer: PRIVATE HEALTH INSURANCE | Admitting: Student

## 2020-05-31 ENCOUNTER — Ambulatory Visit: Payer: PRIVATE HEALTH INSURANCE | Attending: Nurse Practitioner | Admitting: Nurse Practitioner

## 2020-05-31 ENCOUNTER — Encounter: Payer: Self-pay | Admitting: Nurse Practitioner

## 2020-05-31 VITALS — Ht 60.0 in | Wt 181.0 lb

## 2020-05-31 DIAGNOSIS — Z1231 Encounter for screening mammogram for malignant neoplasm of breast: Secondary | ICD-10-CM

## 2020-05-31 DIAGNOSIS — Z13220 Encounter for screening for lipoid disorders: Secondary | ICD-10-CM

## 2020-05-31 DIAGNOSIS — Z130 Encounter for screening for diseases of the blood and blood-forming organs and certain disorders involving the immune mechanism: Secondary | ICD-10-CM | POA: Diagnosis not present

## 2020-05-31 DIAGNOSIS — Z13228 Encounter for screening for other metabolic disorders: Secondary | ICD-10-CM | POA: Diagnosis not present

## 2020-05-31 DIAGNOSIS — Z7689 Persons encountering health services in other specified circumstances: Secondary | ICD-10-CM | POA: Diagnosis not present

## 2020-05-31 DIAGNOSIS — Z131 Encounter for screening for diabetes mellitus: Secondary | ICD-10-CM

## 2020-05-31 NOTE — Progress Notes (Signed)
Virtual Visit via Telephone Note Due to national recommendations of social distancing due to [place] 19, telehealth visit is felt to be most appropriate for this patient at this time.  I discussed the limitations, risks, security and privacy concerns of performing an evaluation and management service by telephone and the availability of in person appointments. I also discussed with the patient that there may be a patient responsible charge related to this service. The patient expressed understanding and agreed to proceed.    I connected with Daniel Johndrow on 05/31/20  at   9:50 AM EDT  EDT by telephone and verified that I am speaking with the correct person using two identifiers.   Consent I discussed the limitations, risks, security and privacy concerns of performing an evaluation and management service by telephone and the availability of in person appointments. I also discussed with the patient that there may be a patient responsible charge related to this service. The patient expressed understanding and agreed to proceed.   Location of Patient: Private Residence    Location of Provider: [place] [facility] [place] participating in Telemedicine visit: Geryl Rankins FNP-BC [place]    History of Present Illness: Telemedicine visit for: Establish Care  has no past medical history on file.  Patient has been counseled on age-appropriate routine health concerns for screening and prevention. These are reviewed and up-to-date. Referrals have been placed accordingly. Immunizations are up-to-date or declined.    Mammogram: Referral placed today.   History reviewed. No pertinent past medical history.  Past Surgical History:  Procedure Laterality Date   NO PAST SURGERIES      Family History  Problem Relation Age of Onset   Healthy Mother    Diabetes Father    Hypertension Father     Social History   Socioeconomic History    Marital status: Married    Spouse name: Not on file   Number of children: Not on file   Years of education: Not on file   Highest education level: Not on file  Occupational History   Not on file  Tobacco Use   Smoking status: Never Smoker   Smokeless tobacco: Never Used  Vaping Use   Vaping Use: Never used  Substance and Sexual Activity   Alcohol use: Never   Drug use: Never   Sexual activity: Yes    Birth control/protection: None  Other Topics Concern   Not on file  Social History Narrative   Not on file   Social Determinants of Health   Financial Resource Strain:    Difficulty of Paying Living Expenses: Not on file  Food Insecurity:    Worried About [place] in the Last Year: Not on file   [employer] of Food in the Last Year: Not on file  Transportation Needs:    Lack of Transportation (Medical): Not on file   Lack of Transportation (Non-Medical): Not on file  Physical Activity:    Days of Exercise per Week: Not on file   Minutes of Exercise per Session: Not on file  Stress:    Feeling of Stress : Not on file  Social Connections:    Frequency of Communication with Friends and Family: Not on file   Frequency of Social Gatherings with Friends and Family: Not on file   Attends Religious Services: Not on file   Active Member of Clubs or Organizations: Not on file   Attends Archivist Meetings:  Not on file   Marital Status: Not on file     Observations/Objective: Awake, alert and oriented x 3   ROS  Assessment and Plan: Mahrosh was seen today for new patient (initial visit).  Diagnoses and all orders for this visit:  Encounter to establish care  Breast cancer screening by mammogram -     MM 3D SCREEN BREAST BILATERAL; Future  Screening for deficiency anemia -     CBC; Future  Screening for metabolic disorder -     UTM54+[id number]; Future  Encounter for screening for diabetes mellitus -     Hemoglobin A1c;  Future  Lipid screening -     Lipid panel; Future     Follow Up Instructions Return in about 6 weeks (around 07/12/2020).     I discussed the assessment and treatment plan with the patient. The patient was provided an opportunity to ask questions and all were answered. The patient agreed with the plan and demonstrated an understanding of the instructions.   The patient was advised to call back or seek an in-person evaluation if the symptoms worsen or if the condition fails to improve as anticipated.  I provided 16 minutes of non-face-to-face time during this encounter including median intraservice time, reviewing previous notes, labs, imaging, medications and explaining diagnosis and management.  Gildardo Pounds, FNP-BC

## 2020-06-07 ENCOUNTER — Ambulatory Visit: Payer: PRIVATE HEALTH INSURANCE | Attending: Family Medicine

## 2020-06-07 ENCOUNTER — Other Ambulatory Visit: Payer: Self-pay

## 2020-06-07 ENCOUNTER — Other Ambulatory Visit: Payer: PRIVATE HEALTH INSURANCE

## 2020-06-07 DIAGNOSIS — Z13220 Encounter for screening for lipoid disorders: Secondary | ICD-10-CM

## 2020-06-07 DIAGNOSIS — Z13228 Encounter for screening for other metabolic disorders: Secondary | ICD-10-CM

## 2020-06-07 DIAGNOSIS — Z131 Encounter for screening for diabetes mellitus: Secondary | ICD-10-CM

## 2020-06-07 DIAGNOSIS — Z130 Encounter for screening for diseases of the blood and blood-forming organs and certain disorders involving the immune mechanism: Secondary | ICD-10-CM

## 2020-06-08 LAB — CMP14+EGFR
ALT: 13 IU/L (ref 0–32)
AST: 13 IU/L (ref 0–40)
Albumin/Globulin Ratio: 1.3 (ref 1.2–2.2)
Albumin: 4.2 g/dL (ref 3.8–4.8)
Alkaline Phosphatase: 65 IU/L (ref 48–121)
BUN/Creatinine Ratio: 17 (ref 9–23)
BUN: 13 mg/dL (ref 6–20)
Bilirubin Total: 0.7 mg/dL (ref 0.0–1.2)
CO2: 26 mmol/L (ref 20–29)
Calcium: 9.7 mg/dL (ref 8.7–10.2)
Chloride: 104 mmol/L (ref 96–106)
Creatinine, Ser: 0.75 mg/dL (ref 0.57–1.00)
GFR calc Af Amer: 116 mL/min/{1.73_m2} (ref >59)
GFR calc non Af Amer: 101 mL/min/{1.73_m2} (ref >59)
Globulin, Total: 3.2 g/dL (ref 1.5–4.5)
Glucose: 90 mg/dL (ref 65–99)
Potassium: 4.2 mmol/L (ref 3.5–5.2)
Sodium: 139 mmol/L (ref 134–144)
Total Protein: 7.4 g/dL (ref 6.0–8.5)

## 2020-06-08 LAB — HEMOGLOBIN A1C
Est. average glucose Bld gHb Est-mCnc: 114 mg/dL
Hgb A1c MFr Bld: 5.6 % (ref 4.8–5.6)

## 2020-06-08 LAB — CBC
Hematocrit: 38.7 % (ref 34.0–46.6)
Hemoglobin: 11.8 g/dL (ref 11.1–15.9)
MCH: 24.4 pg — ABNORMAL LOW (ref 26.6–33.0)
MCHC: 30.5 g/dL — ABNORMAL LOW (ref 31.5–35.7)
MCV: 80 fL (ref 79–97)
Platelets: 210 10*3/uL (ref 150–450)
RBC: 4.83 x10E6/uL (ref 3.77–5.28)
RDW: 13 % (ref 11.7–15.4)
WBC: 6 10*3/uL (ref 3.4–10.8)

## 2020-06-08 LAB — LIPID PANEL
Chol/HDL Ratio: 3.1 ratio (ref 0.0–4.4)
Cholesterol, Total: 141 mg/dL (ref 100–199)
HDL: 45 mg/dL (ref >39)
LDL Chol Calc (NIH): 82 mg/dL (ref 0–99)
Triglycerides: 68 mg/dL (ref 0–149)
VLDL Cholesterol Cal: 14 mg/dL (ref 5–40)

## 2020-07-09 ENCOUNTER — Other Ambulatory Visit: Payer: Self-pay

## 2020-07-09 ENCOUNTER — Ambulatory Visit: Payer: PRIVATE HEALTH INSURANCE | Attending: Nurse Practitioner | Admitting: Nurse Practitioner

## 2020-07-09 ENCOUNTER — Encounter: Payer: Self-pay | Admitting: Nurse Practitioner

## 2020-07-09 VITALS — BP 127/82 | HR 62 | Resp 16 | Ht 64.5 in | Wt 176.2 lb

## 2020-07-09 DIAGNOSIS — Z0000 Encounter for general adult medical examination without abnormal findings: Secondary | ICD-10-CM | POA: Diagnosis not present

## 2020-07-09 NOTE — Progress Notes (Signed)
Assessment & Plan:  Crystal Hardy was seen today for annual exam.  Diagnoses and all orders for this visit:  Annual physical exam Discussed labs today.    Patient has been counseled on age-appropriate routine health concerns for screening and prevention. These are reviewed and up-to-date. Referrals have been placed accordingly. Immunizations are up-to-date or declined.    Subjective:   Chief Complaint  Patient presents with  . Annual Exam   HPI Crystal Hardy 39 y.o. female presents to office today for physical.   Review of Systems  Constitutional: Negative.  Negative for chills, fever, malaise/fatigue and weight loss.  Respiratory: Negative.  Negative for cough, sputum production, shortness of breath and wheezing.   Cardiovascular: Negative.  Negative for chest pain and leg swelling.  Gastrointestinal: Negative.  Negative for abdominal pain, blood in stool, constipation, diarrhea, heartburn, melena, nausea and vomiting.  Genitourinary: Negative.   Skin: Negative.  Negative for rash.  Neurological: Negative.  Negative for dizziness, tremors, speech change, focal weakness, seizures and headaches.  Psychiatric/Behavioral: Negative.  Negative for depression and suicidal ideas. The patient is not nervous/anxious and does not have insomnia.     History reviewed. No pertinent past medical history.  Past Surgical History:  Procedure Laterality Date  . NO PAST SURGERIES      Family History  Problem Relation Age of Onset  . Healthy Mother   . Diabetes Father   . Hypertension Father     Social History Reviewed with no changes to be made today.   Outpatient Medications Prior to Visit  Medication Sig Dispense Refill  . ibuprofen (ADVIL) 600 MG tablet Take 1 tablet (600 mg total) by mouth every 6 (six) hours as needed. (Patient not taking: Reported on 07/09/2020) 30 tablet 0   No facility-administered medications prior to visit.    No Known Allergies     Objective:    BP  127/82   Pulse 62   Resp 16   Ht 5' 4.5" (1.638 m)   Wt 176 lb 3.2 oz (79.9 kg)   SpO2 97%   BMI 29.78 kg/m  Wt Readings from Last 3 Encounters:  07/09/20 176 lb 3.2 oz (79.9 kg)  05/31/20 181 lb (82.1 kg)  09/23/19 181 lb (82.1 kg)    Physical Exam Constitutional:      Appearance: She is well-developed.  HENT:     Head: Normocephalic and atraumatic.     Right Ear: Hearing, tympanic membrane, ear canal and external ear normal.     Left Ear: Hearing, tympanic membrane, ear canal and external ear normal.     Nose: Nose normal.     Mouth/Throat:     Mouth: Mucous membranes are moist.     Dentition: Normal dentition.     Tongue: No lesions.     Palate: No mass.     Pharynx: No oropharyngeal exudate.  Eyes:     General: No scleral icterus.       Right eye: No discharge.     Conjunctiva/sclera: Conjunctivae normal.     Pupils: Pupils are equal, round, and reactive to light.  Neck:     Thyroid: No thyromegaly.     Trachea: No tracheal deviation.  Cardiovascular:     Rate and Rhythm: Normal rate and regular rhythm.     Heart sounds: Normal heart sounds. No murmur heard.  No friction rub.  Pulmonary:     Effort: Pulmonary effort is normal. No accessory muscle usage or respiratory distress.  Breath sounds: Normal breath sounds. No decreased breath sounds, wheezing, rhonchi or rales.  Chest:     Chest wall: No tenderness.     Breasts: Breasts are symmetrical.        Right: No inverted nipple, mass, nipple discharge, skin change or tenderness.        Left: No inverted nipple, mass, nipple discharge, skin change or tenderness.  Abdominal:     General: Bowel sounds are normal. There is no distension.     Palpations: Abdomen is soft. There is no mass.     Tenderness: There is no abdominal tenderness. There is no guarding or rebound.  Musculoskeletal:        General: No tenderness or deformity. Normal range of motion.     Cervical back: Normal range of motion and neck supple.   Lymphadenopathy:     Cervical: No cervical adenopathy.  Skin:    General: Skin is warm and dry.     Findings: No erythema.  Neurological:     Mental Status: She is alert and oriented to person, place, and time.     Cranial Nerves: No cranial nerve deficit.     Sensory: Sensation is intact.     Motor: Motor function is intact.     Coordination: Coordination is intact. Coordination normal.     Gait: Gait is intact.     Deep Tendon Reflexes: Reflexes are normal and symmetric.  Psychiatric:        Attention and Perception: Attention normal.        Mood and Affect: Mood normal.        Speech: Speech normal.        Behavior: Behavior normal.        Thought Content: Thought content normal.        Cognition and Memory: Cognition and memory normal.        Judgment: Judgment normal.         Patient has been counseled extensively about nutrition and exercise as well as the importance of adherence with medications and regular follow-up. The patient was given clear instructions to go to ER or return to medical center if symptoms don't improve, worsen or new problems develop. The patient verbalized understanding.   Follow-up: Return if symptoms worsen or fail to improve.   Claiborne Rigg, FNP-BC [facility] [place], [place] [phone]   07/09/2020, 1:14 PM

## 2021-07-12 ENCOUNTER — Ambulatory Visit
Admission: RE | Admit: 2021-07-12 | Discharge: 2021-07-12 | Disposition: A | Discharge status: Home / Self Care | Payer: PRIVATE HEALTH INSURANCE | Source: Ambulatory Visit | Attending: Nurse Practitioner | Admitting: Nurse Practitioner

## 2021-07-12 ENCOUNTER — Other Ambulatory Visit: Payer: Self-pay

## 2021-07-12 ENCOUNTER — Other Ambulatory Visit: Payer: Self-pay | Admitting: Nurse Practitioner

## 2021-07-12 DIAGNOSIS — Z1231 Encounter for screening mammogram for malignant neoplasm of breast: Secondary | ICD-10-CM

## 2021-08-03 ENCOUNTER — Encounter: Payer: Self-pay | Admitting: Nurse Practitioner

## 2021-08-03 ENCOUNTER — Ambulatory Visit: Payer: PRIVATE HEALTH INSURANCE | Attending: Nurse Practitioner | Admitting: Nurse Practitioner

## 2021-08-03 ENCOUNTER — Other Ambulatory Visit: Payer: Self-pay

## 2021-08-03 VITALS — BP 124/82 | HR 70 | Ht 69.0 in | Wt 177.6 lb

## 2021-08-03 DIAGNOSIS — Z13228 Encounter for screening for other metabolic disorders: Secondary | ICD-10-CM | POA: Diagnosis not present

## 2021-08-03 DIAGNOSIS — Z131 Encounter for screening for diabetes mellitus: Secondary | ICD-10-CM | POA: Diagnosis not present

## 2021-08-03 DIAGNOSIS — M546 Pain in thoracic spine: Secondary | ICD-10-CM | POA: Diagnosis not present

## 2021-08-03 DIAGNOSIS — Z130 Encounter for screening for diseases of the blood and blood-forming organs and certain disorders involving the immune mechanism: Secondary | ICD-10-CM | POA: Diagnosis not present

## 2021-08-03 MED ORDER — METHOCARBAMOL 500 MG PO TABS: 500.0000 mg | ORAL_TABLET | Freq: Three times a day (TID) | ORAL | 0 refills | Status: AC | PRN

## 2021-08-03 MED ORDER — IBUPROFEN 800 MG PO TABS: 800.0000 mg | ORAL_TABLET | Freq: Three times a day (TID) | ORAL | 1 refills | Status: DC | PRN

## 2021-08-03 NOTE — Progress Notes (Signed)
Assessment & Plan:  Crystal Hardy was seen today for back pain.  Diagnoses and all orders for this visit:  Acute left-sided thoracic back pain -     methocarbamol (ROBAXIN) 500 MG tablet; Take 1 tablet (500 mg total) by mouth every 8 (eight) hours as needed for muscle spasms. -     ibuprofen (ADVIL) 800 MG tablet; Take 1 tablet (800 mg total) by mouth every 8 (eight) hours as needed.  Encounter for screening for diabetes mellitus -     Hemoglobin A1c  Screening for deficiency anemia -     CBC  Screening for metabolic disorder -     Basic metabolic panel   Patient has been counseled on age-appropriate routine health concerns for screening and prevention. These are reviewed and up-to-date. Referrals have been placed accordingly. Immunizations are up-to-date or declined.    Subjective:   Chief Complaint  Patient presents with   Back Pain   HPI Crystal Hardy 40 y.o. female presents to office today with complaints of left sided thoracic back pain  has no past medical history on file.   Back Pain: Patient presents for presents evaluation of left sided thoracic back problems.  Symptoms have been present for 1 month and include pain in the left scapula and upper left inner arm radiating from axillary to forearm (aching, burning, sharp, and tight band in character; 6/10 in severity). Initial inciting event:  lifts heavy boxes up to 40lbs at work . Symptoms are worst: after lifting boxes all day at work . Alleviating factors identifiable by patient are  decreased activity . Treatments so far initiated by patient:  OTC ALEVE  Previous lower back problems: none. Previous workup: none. Previous treatments: none.   Review of Systems  Constitutional:  Negative for fever, malaise/fatigue and weight loss.  HENT: Negative.  Negative for nosebleeds.   Eyes: Negative.  Negative for blurred vision, double vision and photophobia.  Respiratory: Negative.  Negative for cough and shortness of breath.    Cardiovascular: Negative.  Negative for chest pain, palpitations and leg swelling.  Gastrointestinal: Negative.  Negative for heartburn, nausea and vomiting.  Musculoskeletal:  Positive for back pain and myalgias.  Neurological: Negative.  Negative for dizziness, focal weakness, seizures and headaches.  Psychiatric/Behavioral: Negative.  Negative for suicidal ideas.    History reviewed. No pertinent past medical history.  Past Surgical History:  Procedure Laterality Date   NO PAST SURGERIES      Family History  Problem Relation Age of Onset   Healthy Mother    Diabetes Father    Hypertension Father     Social History Reviewed with no changes to be made today.   Outpatient Medications Prior to Visit  Medication Sig Dispense Refill   ibuprofen (ADVIL) 600 MG tablet Take 1 tablet (600 mg total) by mouth every 6 (six) hours as needed. (Patient not taking: No sig reported) 30 tablet 0   No facility-administered medications prior to visit.    No Known Allergies     Objective:    BP 124/82   Pulse 70   Ht 5\' 9"  (1.753 m)   Wt 177 lb 9.6 oz (80.6 kg)   SpO2 99%   BMI 26.23 kg/m  Wt Readings from Last 3 Encounters:  08/03/21 177 lb 9.6 oz (80.6 kg)  07/09/20 176 lb 3.2 oz (79.9 kg)  05/31/20 181 lb (82.1 kg)    Physical Exam Vitals and nursing note reviewed.  Constitutional:  Appearance: She is well-developed.  HENT:     Head: Normocephalic and atraumatic.  Cardiovascular:     Rate and Rhythm: Normal rate and regular rhythm.     Heart sounds: Normal heart sounds. No murmur heard.   No friction rub. No gallop.  Pulmonary:     Effort: Pulmonary effort is normal. No tachypnea or respiratory distress.     Breath sounds: Normal breath sounds. No decreased breath sounds, wheezing, rhonchi or rales.  Chest:     Chest wall: No tenderness.  Abdominal:     General: Bowel sounds are normal.     Palpations: Abdomen is soft.  Musculoskeletal:        General: Normal  range of motion.     Right upper arm: Normal.     Left upper arm: Tenderness (pain elicited in the biceps brachii area and left scapula with passive straight raise of left arm) present. No swelling.     Cervical back: Normal range of motion.  Skin:    General: Skin is warm and dry.  Neurological:     Mental Status: She is alert and oriented to person, place, and time.     Coordination: Coordination normal.  Psychiatric:        Behavior: Behavior normal. Behavior is cooperative.        Thought Content: Thought content normal.        Judgment: Judgment normal.         Patient has been counseled extensively about nutrition and exercise as well as the importance of adherence with medications and regular follow-up. The patient was given clear instructions to go to ER or return to medical center if symptoms don't improve, worsen or new problems develop. The patient verbalized understanding.   Follow-up: Return for 3 weeks tele on tuesday back pain.   Claiborne Rigg, FNP-BC [facility] and [facility] [place], [place] [phone]   08/03/2021, 10:59 AM

## 2021-08-03 NOTE — Progress Notes (Signed)
Back pain

## 2021-08-04 LAB — BASIC METABOLIC PANEL
BUN/Creatinine Ratio: 23 (ref 9–23)
BUN: 18 mg/dL (ref 6–24)
CO2: 24 mmol/L (ref 20–29)
Calcium: 9.7 mg/dL (ref 8.7–10.2)
Chloride: 104 mmol/L (ref 96–106)
Creatinine, Ser: 0.78 mg/dL (ref 0.57–1.00)
Glucose: 89 mg/dL (ref 70–99)
Potassium: 4.3 mmol/L (ref 3.5–5.2)
Sodium: 140 mmol/L (ref 134–144)
eGFR: 98 mL/min/{1.73_m2} (ref >59)

## 2021-08-04 LAB — CBC
Hematocrit: 37.9 % (ref 34.0–46.6)
Hemoglobin: 11.9 g/dL (ref 11.1–15.9)
MCH: 25 pg — ABNORMAL LOW (ref 26.6–33.0)
MCHC: 31.4 g/dL — ABNORMAL LOW (ref 31.5–35.7)
MCV: 80 fL (ref 79–97)
Platelets: 209 10*3/uL (ref 150–450)
RBC: 4.76 x10E6/uL (ref 3.77–5.28)
RDW: 12.9 % (ref 11.7–15.4)
WBC: 7.5 10*3/uL (ref 3.4–10.8)

## 2021-08-04 LAB — HEMOGLOBIN A1C
Est. average glucose Bld gHb Est-mCnc: 123 mg/dL
Hgb A1c MFr Bld: 5.9 % — ABNORMAL HIGH (ref 4.8–5.6)

## 2021-08-05 ENCOUNTER — Telehealth: Payer: Self-pay

## 2021-08-05 NOTE — Telephone Encounter (Signed)
-----   Message from Grayce Sessions, NP sent at 08/04/2021 11:30 AM EDT ----- Labs are  normal.  Work on eating a low fat, heart healthy diet and participate in regular aerobic exercise program to control as well. Exercise at least  30 minutes per day-5 days per week. Avoid red meat. No fried foods. No junk foods, sodas, sugary foods or drinks, unhealthy snacking, alcohol or smoking.

## 2022-05-29 ENCOUNTER — Encounter: Payer: Self-pay | Admitting: Nurse Practitioner

## 2022-05-29 ENCOUNTER — Ambulatory Visit: Payer: PRIVATE HEALTH INSURANCE | Attending: Nurse Practitioner | Admitting: Nurse Practitioner

## 2022-05-29 VITALS — BP 119/81 | HR 71 | Temp 98.1°F | Wt 176.2 lb

## 2022-05-29 DIAGNOSIS — D649 Anemia, unspecified: Secondary | ICD-10-CM | POA: Diagnosis not present

## 2022-05-29 DIAGNOSIS — R0789 Other chest pain: Secondary | ICD-10-CM | POA: Diagnosis not present

## 2022-05-29 DIAGNOSIS — R7303 Prediabetes: Secondary | ICD-10-CM

## 2022-05-29 MED ORDER — IBUPROFEN 800 MG PO TABS: 800.0000 mg | ORAL_TABLET | Freq: Three times a day (TID) | ORAL | 1 refills | Status: AC | PRN

## 2022-05-29 MED ORDER — METHOCARBAMOL 500 MG PO TABS: 500.0000 mg | ORAL_TABLET | Freq: Four times a day (QID) | ORAL | 0 refills | Status: AC

## 2022-05-29 NOTE — Progress Notes (Signed)
Assessment & Plan:  Crystal Hardy was seen today for breast pain.  Diagnoses and all orders for this visit:  Left-sided chest wall pain -     methocarbamol (ROBAXIN) 500 MG tablet; Take 1 tablet (500 mg total) by mouth 4 (four) times daily. For pain under ribs -     ibuprofen (ADVIL) 800 MG tablet; Take 1 tablet (800 mg total) by mouth every 8 (eight) hours as needed.  Prediabetes Well controlled with diet only at this time. -     Hemoglobin A1c  Anemia, unspecified type -     CBC with Differential    Patient has been counseled on age-appropriate routine health concerns for screening and prevention. These are reviewed and up-to-date. Referrals have been placed accordingly. Immunizations are up-to-date or declined.    Subjective:   Chief Complaint  Patient presents with   Breast Pain   HPI Crystal Hardy 41 y.o. female presents to office today with complaints of left sided anterior chest wall pain.  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 Pain is reproducible. She denies any breast pain or palpable lumps.  Review of Systems  Constitutional:  Negative for fever, malaise/fatigue and weight loss.  HENT: Negative.  Negative for nosebleeds.   Eyes: Negative.  Negative for blurred vision, double vision and photophobia.  Respiratory: Negative.  Negative for cough and shortness of breath.   Cardiovascular: Negative.  Negative for chest pain, palpitations and leg swelling.  Gastrointestinal: Negative.  Negative for heartburn, nausea and vomiting.  Musculoskeletal:  Positive for myalgias.  Neurological: Negative.  Negative for dizziness, focal weakness, seizures and headaches.  Psychiatric/Behavioral: Negative.  Negative for suicidal ideas.     History reviewed. No pertinent past medical history.  Past Surgical History:  Procedure Laterality Date   NO  PAST SURGERIES      Family History  Problem Relation Age of Onset   Healthy Mother    Diabetes Father    Hypertension Father     Social History Reviewed with no changes to be made today.   Outpatient Medications Prior to Visit  Medication Sig Dispense Refill   ibuprofen (ADVIL) 800 MG tablet Take 1 tablet (800 mg total) by mouth every 8 (eight) hours as needed. (Patient not taking: Reported on 05/29/2022) 60 tablet 1   No facility-administered medications prior to visit.    No Known Allergies     Objective:    BP 119/81   Pulse 71   Temp 98.1 F (36.7 C) (Oral)   Wt 176 lb 3.2 oz (79.9 kg)   SpO2 98%   BMI 26.02 kg/m  Wt Readings from Last 3 Encounters:  05/29/22 176 lb 3.2 oz (79.9 kg)  08/03/21 177 lb 9.6 oz (80.6 kg)  07/09/20 176 lb 3.2 oz (79.9 kg)    Physical Exam Vitals and nursing note reviewed.  Constitutional:      Appearance: She is well-developed.  HENT:     Head: Normocephalic and atraumatic.  Cardiovascular:     Rate and Rhythm: Normal rate and regular rhythm.     Heart sounds: Normal heart sounds. No murmur heard.    No friction rub. No gallop.  Pulmonary:     Effort: Pulmonary effort is normal. No tachypnea or respiratory distress.     Breath sounds: Normal breath sounds. No decreased breath sounds, wheezing,  rhonchi or rales.  Chest:     Chest wall: No mass or tenderness.  Breasts:    Right: Normal. No mass or tenderness.     Left: Normal. No mass or tenderness.    Abdominal:     General: Bowel sounds are normal.     Palpations: Abdomen is soft.  Musculoskeletal:        General: Normal range of motion.     Cervical back: Normal range of motion.  Skin:    General: Skin is warm and dry.  Neurological:     Mental Status: She is alert and oriented to person, place, and time.     Coordination: Coordination normal.  Psychiatric:        Behavior: Behavior normal. Behavior is cooperative.        Thought Content: Thought content normal.         Judgment: Judgment normal.          Patient has been counseled extensively about nutrition and exercise as well as the importance of adherence with medications and regular follow-up. The patient was given clear instructions to go to ER or return to medical center if symptoms don't improve, worsen or new problems develop. The patient verbalized understanding.   Follow-up: Return if symptoms worsen or fail to improve.   Claiborne Rigg, FNP-BC [facility] and [facility] [place], [place] [phone]   05/29/2022, 1:25 PM

## 2022-07-27 IMAGING — MG MM DIGITAL SCREENING BILAT W/ TOMO AND CAD
8 series · 8 of 24 positions shown · non-contrast
Comparison: None.

CLINICAL DATA: Screening.

EXAM:
DIGITAL SCREENING BILATERAL MAMMOGRAM WITH TOMOSYNTHESIS AND CAD
TECHNIQUE: Bilateral screening digital craniocaudal and mediolateral oblique
mammograms were obtained. Bilateral screening digital breast
tomosynthesis was performed. The images were evaluated with
computer-aided detection.

[R CC synth-2D]
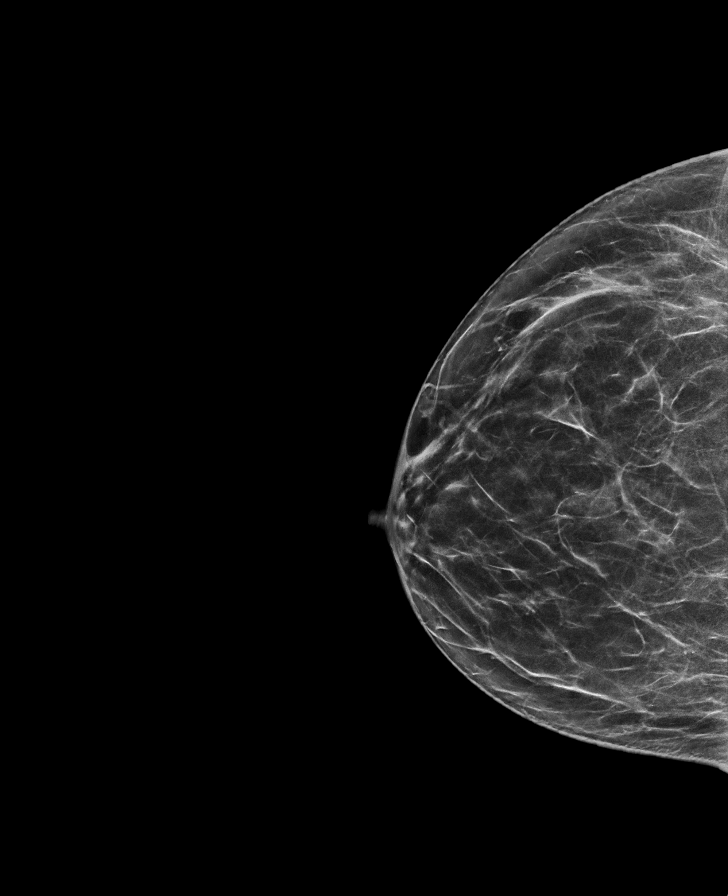

[R MLO synth-2D]
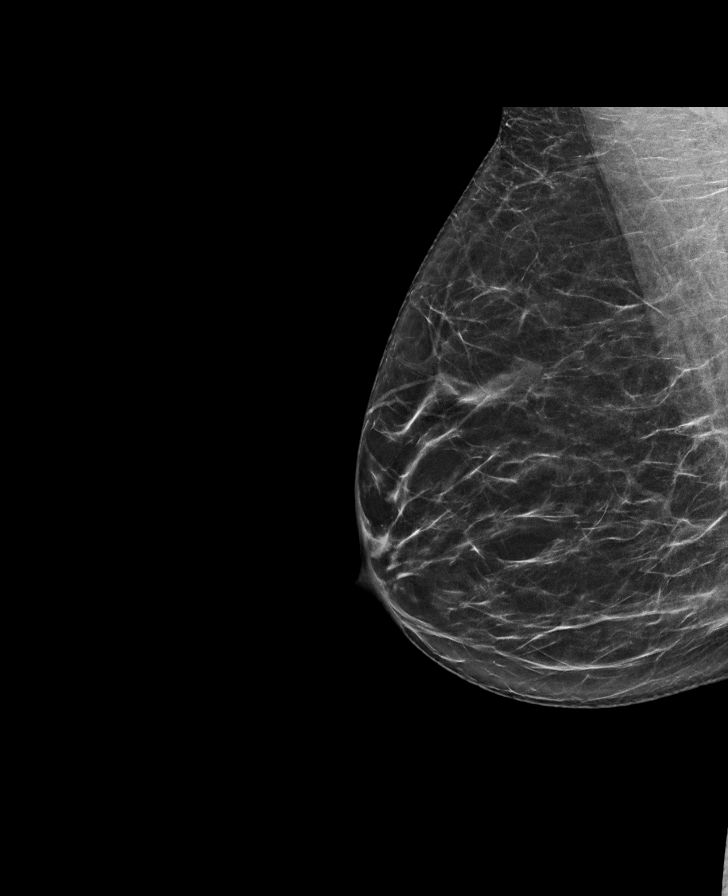

[L MLO synth-2D]
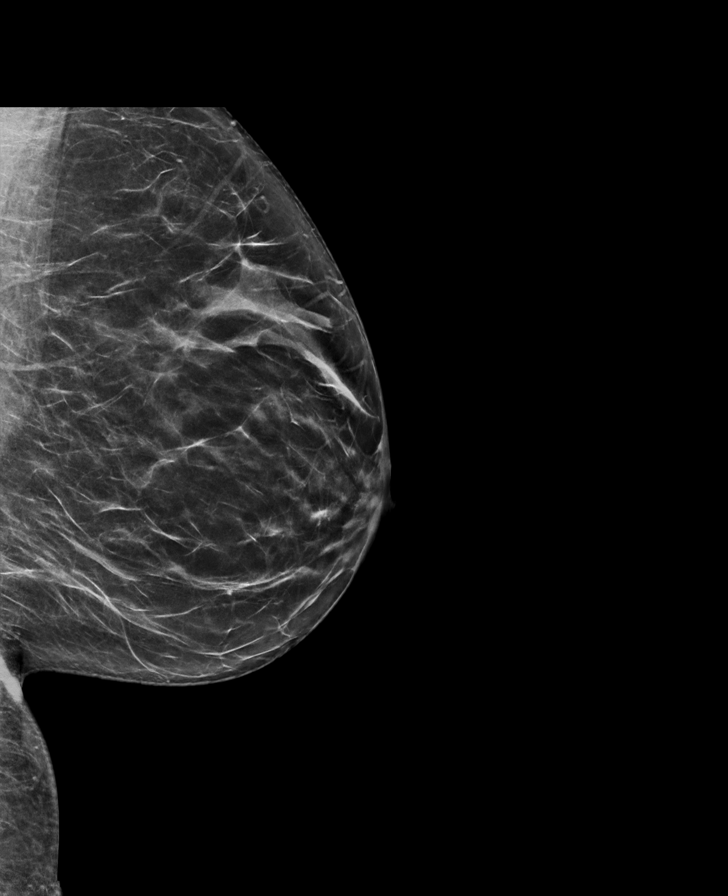

[L CC synth-2D]
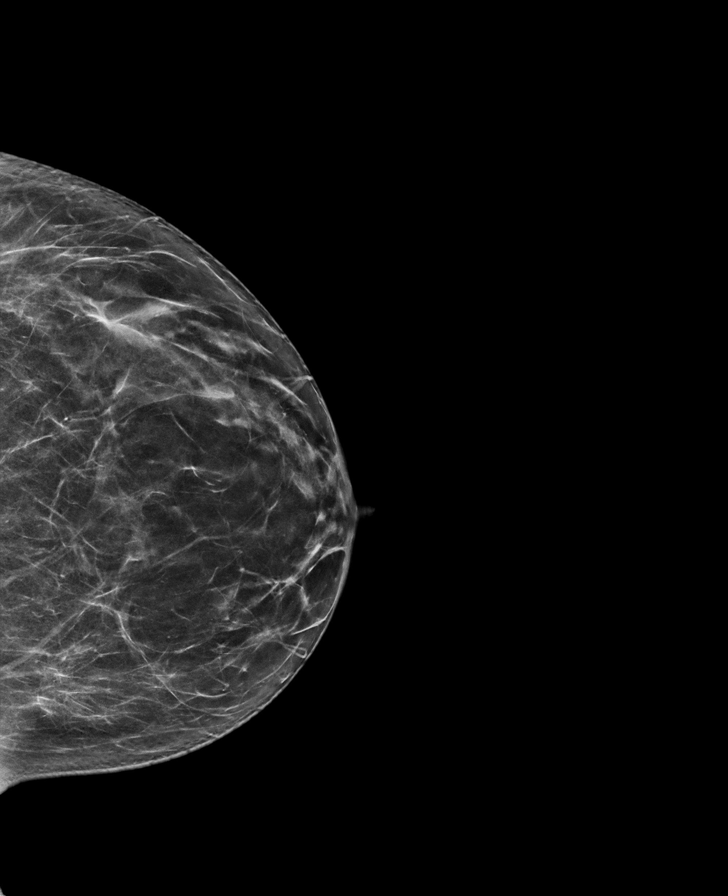

[R MLO tomo · tomo slice 35/68.0]
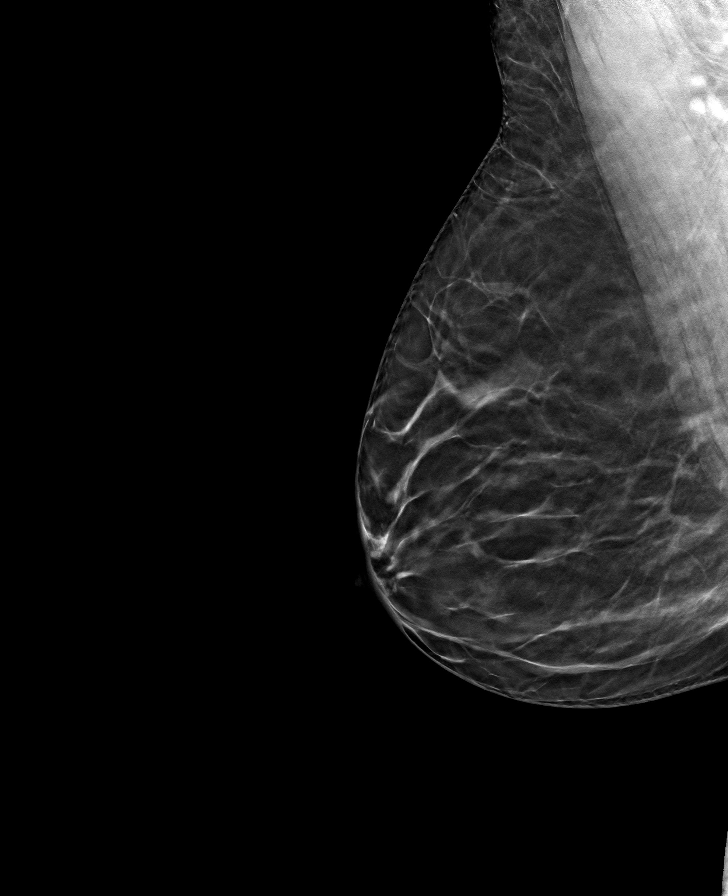

[R CC tomo · tomo slice 33/65.0]
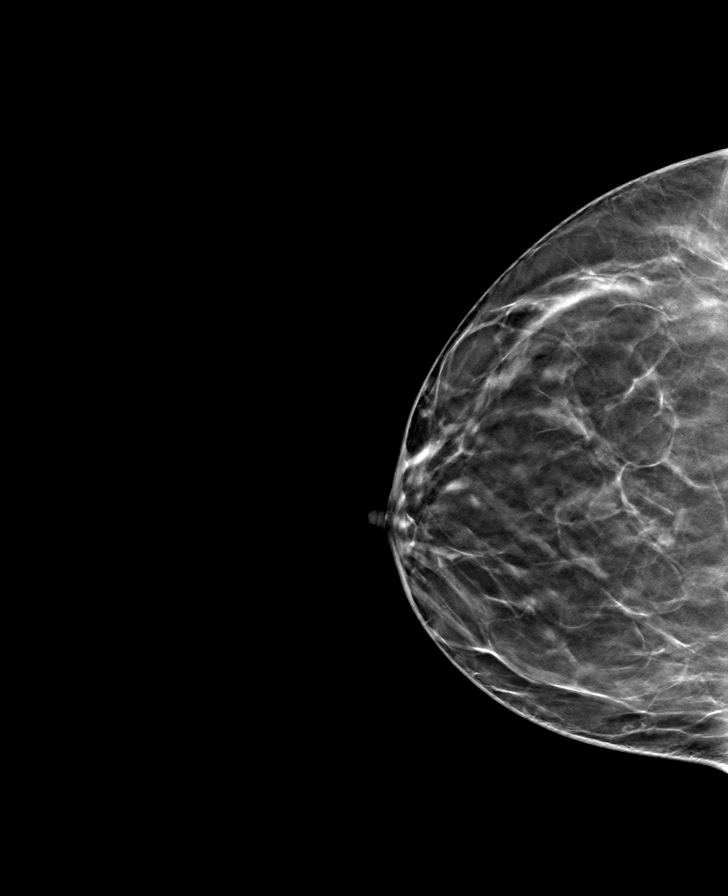

[L MLO tomo · tomo slice 36/71.0]
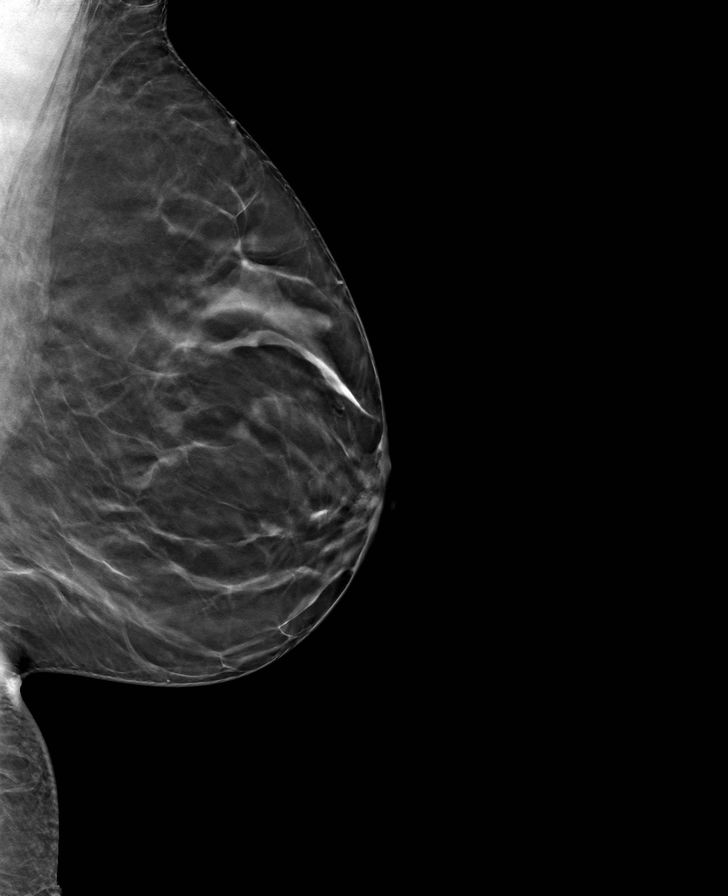

[L CC tomo · tomo slice 35/68.0]
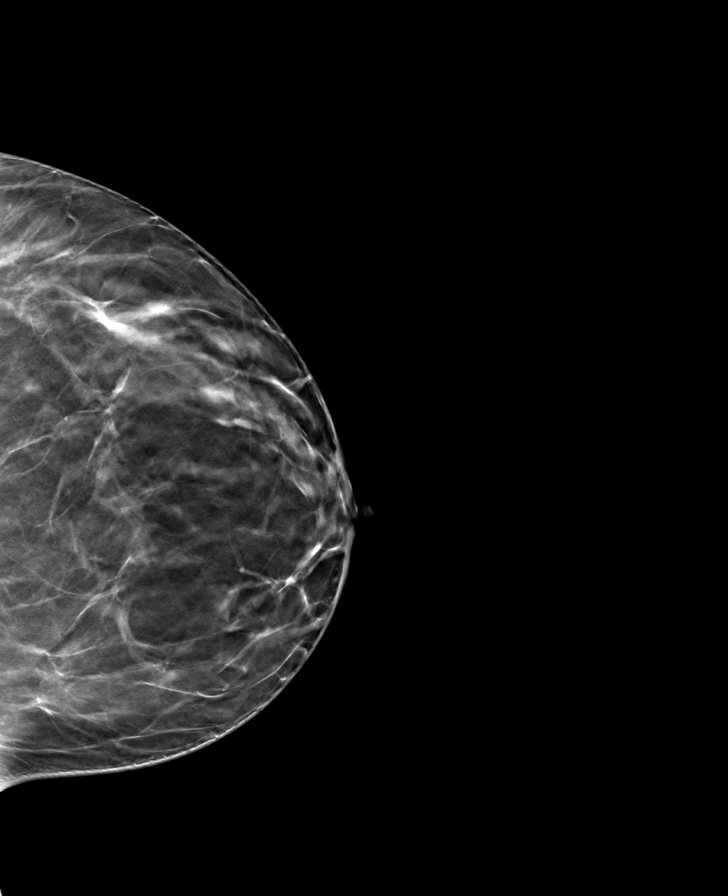

[8 of 24 positions shown; findings below may reference images not displayed]

ACR Breast Density Category b: There are scattered areas of
fibroglandular density.
FINDINGS: There are no findings suspicious for malignancy.
IMPRESSION: No mammographic evidence of malignancy. A result letter of this
screening mammogram will be mailed directly to the patient.

RECOMMENDATION:
Screening mammogram in one year. (Code:XG-X-X7B)

BI-RADS CATEGORY  1: Negative.

## 2022-12-06 ENCOUNTER — Ambulatory Visit: Payer: Self-pay | Admitting: *Deleted

## 2022-12-06 NOTE — Telephone Encounter (Signed)
  Chief Complaint: "When I wash my face I feel something moving in my head" Symptoms: "I feel something moving in my head when I wash my face".   Hears a "crunching sound" when I asked if she heard anything when this occurs.    Frequency: Not sure.   Sunday morning she noticed something moving in her head when she washed her face and it happens every time she washes her face. Pertinent Negatives: Patient denies Accidents, injuries, headaches, dizziness, passing out. Disposition: []$ ED /[]$ Urgent Care (no appt availability in office) / [x]$ Appointment(In office/virtual)/ []$  [facility]/ []$ Home Care/ []$ Refused Recommended Disposition /[]$ [facility]/ []$  Follow-up with PCP Additional Notes: When I went to make her an appt. She already had one scheduled for 12/17/2022 at 9:10 with Freeman Caldron, PA-C for "head discomfort".    I asked her if this appt. Was for this same issue and she said,   "Yes".    Pt agreeable to keeping this appt. And going to the ED if this sensation becomes worse or she passes out of becomes dizzy.    Pt. Agreeable to this plan.

## 2022-12-06 NOTE — Telephone Encounter (Signed)
When I wash my face something is moving inside my head.    It's just moving.    I don't know what it is.   It's moving.    I don't know what it is.    Sunday morning to go to church I washed my face and I could feel something moving in my head.    Asked if she was hearing a sound she said yes.     It's a crackling sound.   When I move my neck I feel it crunching.    It feel like the nerves are moving in my neck and head.    Denies accidents.    No injuries.     Yesterday when I moved my head I felt dizzy.   When I wake up it's not there but when I move my head I'm dizzy.    Today I'm ok.     Asked if she has been sick in the last week or two.   I feel like cold.    Symptoms:   my throat feels scratchy, no runny nose, no coughing, no fever.    No diarrhea or vomiting.   Asked if ever had seizures.   No.   Only medicine she takes is Tylenol.     No shortness of breath or chest pain when this happens.   Reason for Disposition  Nursing judgment or information in reference    She already has an appt. Set up for 12/17/2022 at 9:10 with Freeman Caldron, PA-C with [facility] for "head discomfort".  Answer Assessment - Initial Assessment Questions 1. REASON FOR CALL: "What is your main concern right now?"     "When I wash my face I feel something moving inside my head".   "It's just something moving in my head".   "I don't know what it is".    2. ONSET: "When did the "moving" start?"     Could not tell me other than on Sunday morning when she washed her face something in her head was moving. 3. SEVERITY: "How bad is the sensation?"     "It happens when I wash my face".   "It's a crackling sound" when I asked if she could hear anything when the "moving in her head" occurred.  Also mentioned she hears a crunching sound when she moves her neck and the "nerves are moving in my neck" 4. FEVER: "Do you have a fever?"     No  5. OTHER SYMPTOMS: "Do you have any other new symptoms?"     Felt  dizzy yesterday when she moved her head.    Today feels fine.   Denies being sick or being involved in any accidents or any injuries to her head.   Only medicine she takes is Tylenol.   6. TREATMENTS AND RESPONSE: "What have you done so far to try to make this better? What medicines have you used?"     "I take Tylenol"   7. PREGNANCY: "Is there any chance you are pregnant?" "When was your last menstrual period?"     Not asked  Protocols used: No Guideline Available-A-AH

## 2022-12-27 ENCOUNTER — Ambulatory Visit: Payer: PRIVATE HEALTH INSURANCE | Admitting: Physician Assistant

## 2023-12-14 ENCOUNTER — Telehealth: Payer: Self-pay | Admitting: Nurse Practitioner

## 2023-12-14 NOTE — Telephone Encounter (Signed)
 Contacted pt phone not in service to confirm appt

## 2023-12-17 ENCOUNTER — Ambulatory Visit: Payer: PRIVATE HEALTH INSURANCE | Admitting: Nurse Practitioner

## 2023-12-18 ENCOUNTER — Encounter: Payer: Self-pay | Admitting: Nurse Practitioner

## 2023-12-18 ENCOUNTER — Ambulatory Visit: Payer: PRIVATE HEALTH INSURANCE | Attending: Nurse Practitioner | Admitting: Nurse Practitioner

## 2023-12-18 VITALS — BP 128/85 | HR 70 | Resp 19 | Ht 69.0 in | Wt 182.4 lb

## 2023-12-18 DIAGNOSIS — R7989 Other specified abnormal findings of blood chemistry: Secondary | ICD-10-CM | POA: Diagnosis not present

## 2023-12-18 DIAGNOSIS — N911 Secondary amenorrhea: Secondary | ICD-10-CM | POA: Diagnosis not present

## 2023-12-18 DIAGNOSIS — Z1231 Encounter for screening mammogram for malignant neoplasm of breast: Secondary | ICD-10-CM

## 2023-12-18 DIAGNOSIS — R7303 Prediabetes: Secondary | ICD-10-CM | POA: Diagnosis not present

## 2023-12-18 DIAGNOSIS — R072 Precordial pain: Secondary | ICD-10-CM

## 2023-12-18 NOTE — Progress Notes (Signed)
 I have seen and examined this patient with the advanced practice provider STUDENT and agree with the note below

## 2023-12-18 NOTE — Progress Notes (Signed)
 Assessment & Plan:  Diagnoses and all orders for this visit:  Prediabetes A1c at goal. Controlled with diet.  -     Hemoglobin A1c Continue eating low carbohydrate diet Reduce foods highly processed foods, sugary foods such as soda Exercise 3-5 times per week for at least 30 minutes  Abnormal CBC -     CBC with Differential  Secondary amenorrhea -     hCG, quantitative, pregnancy Reports no menstrual cycle in past six months  Encounter for screening mammogram for malignant neoplasm of breast -     MM 3D SCREENING MAMMOGRAM BILATERAL BREAST; Future  Precordial chest pain Decrease heavy lifting that make worsens pain Take Ibuprofen as prescribed as needed for pain Rest area of discomfort     Patient has been counseled on age-appropriate routine health concerns for screening and prevention. These are reviewed and up-to-date. Referrals have been placed accordingly. Immunizations are up-to-date or declined.    Subjective:   Chief Complaint  Patient presents with   Prediabetes   Amenorrhea    Buna Cuppett Pardini 43 y.o. female presents to office today Of her prediabetes. She requested a physical which we will perform at her next visit. She continues to have intermittent, mild precordial pain with heavy lifting from job. Pain relieved with rest without any analgesics.   She has not had a menstrual period in several months (06-2023). She has a history of secondary amenorrhea and had seen GYN for this in the past. At that time she was instructed that she was postmenopausal and based on labs would have difficulty conceiving.    Patient has been counseled on age-appropriate routine health concerns for screening and prevention. These are reviewed and up-to-date. Referrals have been placed accordingly. Immunizations are up-to-date or declined.     MAMMOGRAM: OVERDUE. Referral placed PAP SMEAR: OVERDUE. Scheduled next visit  Review of Systems  Constitutional: Negative.   HENT:  Negative.    Eyes: Negative.   Respiratory: Negative.    Cardiovascular: Negative.  Negative for chest pain.  Gastrointestinal: Negative.   Musculoskeletal:  Negative for myalgias.       Left chest pain   Skin: Negative.   Neurological: Negative.   Endo/Heme/Allergies: Negative.   Psychiatric/Behavioral: Negative.      No past medical history on file.  Past Surgical History:  Procedure Laterality Date   NO PAST SURGERIES      Family History  Problem Relation Age of Onset   Healthy Mother    Diabetes Father    Hypertension Father     Social History Reviewed with no changes to be made today.   Outpatient Medications Prior to Visit  Medication Sig Dispense Refill   ibuprofen (ADVIL) 800 MG tablet Take 1 tablet (800 mg total) by mouth every 8 (eight) hours as needed. (Patient not taking: Reported on 12/18/2023) 60 tablet 1   No facility-administered medications prior to visit.    No Known Allergies     Objective:    BP 128/85 (BP Location: Left Arm, Patient Position: Sitting, Cuff Size: Normal)   Pulse 70   Resp 19   Ht 5\' 9"  (1.753 m)   Wt 182 lb 6.4 oz (82.7 kg)   LMP 06/20/2023 (Approximate)   SpO2 100%   BMI 26.94 kg/m  Wt Readings from Last 3 Encounters:  12/18/23 182 lb 6.4 oz (82.7 kg)  05/29/22 176 lb 3.2 oz (79.9 kg)  08/03/21 177 lb 9.6 oz (80.6 kg)    Physical Exam Exam  conducted with a chaperone present (Husband).  Constitutional:      Appearance: Normal appearance.  HENT:     Head: Normocephalic.  Cardiovascular:     Rate and Rhythm: Normal rate and regular rhythm.  Pulmonary:     Effort: Pulmonary effort is normal.     Breath sounds: Normal breath sounds.  Chest:     Comments: Intermittent chest discomfort worse when lifting heavy items Musculoskeletal:        General: Normal range of motion.     Right shoulder: Normal.     Left shoulder: Tenderness present. No swelling. Normal range of motion.     Right upper arm: Normal.     Left  upper arm: Tenderness present. No swelling.       Arms:     Cervical back: Normal range of motion.  Skin:    General: Skin is warm and dry.  Neurological:     General: No focal deficit present.     Mental Status: She is alert and oriented to person, place, and time.  Psychiatric:        Attention and Perception: Attention normal.        Mood and Affect: Mood normal.        Behavior: Behavior normal. Behavior is cooperative.        Thought Content: Thought content normal.        Judgment: Judgment normal.         Patient has been counseled extensively about nutrition and exercise as well as the importance of adherence with medications and regular follow-up. The patient was given clear instructions to go to ER or return to medical center if symptoms don't improve, worsen or new problems develop. The patient verbalized understanding.   Follow-up: Return in about 3 months (around 03/19/2024) for pap smear.   Joette Catching, BSN RN -student FNP [facility] and [facility] [place], [place] [phone]   12/18/2023, 10:51 AM

## 2023-12-19 LAB — CBC WITH DIFFERENTIAL/PLATELET
Basophils Absolute: 0 10*3/uL (ref 0.0–0.2)
Basos: 0 %
EOS (ABSOLUTE): 0.1 10*3/uL (ref 0.0–0.4)
Eos: 1 %
Hematocrit: 37.2 % (ref 34.0–46.6)
Hemoglobin: 12.1 g/dL (ref 11.1–15.9)
Immature Grans (Abs): 0 10*3/uL (ref 0.0–0.1)
Immature Granulocytes: 0 %
Lymphocytes Absolute: 4.2 10*3/uL — ABNORMAL HIGH (ref 0.7–3.1)
Lymphs: 57 %
MCH: 25.5 pg — ABNORMAL LOW (ref 26.6–33.0)
MCHC: 32.5 g/dL (ref 31.5–35.7)
MCV: 79 fL (ref 79–97)
Monocytes Absolute: 0.4 10*3/uL (ref 0.1–0.9)
Monocytes: 5 %
Neutrophils Absolute: 2.7 10*3/uL (ref 1.4–7.0)
Neutrophils: 37 %
Platelets: 213 10*3/uL (ref 150–450)
RBC: 4.74 x10E6/uL (ref 3.77–5.28)
RDW: 13 % (ref 11.7–15.4)
WBC: 7.4 10*3/uL (ref 3.4–10.8)

## 2023-12-19 LAB — HEMOGLOBIN A1C
Est. average glucose Bld gHb Est-mCnc: 120 mg/dL
Hgb A1c MFr Bld: 5.8 % — ABNORMAL HIGH (ref 4.8–5.6)

## 2023-12-19 LAB — BETA HCG QUANT (REF LAB): hCG Quant: <1 m[IU]/mL

## 2024-03-24 ENCOUNTER — Ambulatory Visit: Admitting: Nurse Practitioner

## 2024-10-03 ENCOUNTER — Ambulatory Visit: Payer: Self-pay

## 2024-10-03 NOTE — Telephone Encounter (Signed)
 FYI Only or Action Required?: FYI only for provider: ED advised. Reluctant to go   Patient was last seen in primary care on 12/18/2023 by Theotis Haze ORN, NP.  Called Nurse Triage reporting Breast Pain.  Symptoms began a week ago.  Interventions attempted: Rest, hydration, or home remedies. Does not take meds  Symptoms are: gradually worsening.  Triage Disposition: Go to ED Now (Notify PCP)  Patient/caregiver understands and will follow disposition?: Unsure  Copied from CRM [id number]. Topic: Clinical - Red Word Triage >> Oct 03, 2024  9:25 AM Larissa RAMAN wrote: [facility] that prompted transfer to Nurse Triage: abdominal pain   ----------------------------------------------------------------------- From previous Reason for Contact - Scheduling: Patient/patient representative is calling to schedule an appointment. Refer to attachments for appointment information. Reason for Disposition  [1] Chest pain (or angina) comes and goes AND [2] is happening more often (increasing in frequency) or getting worse (increasing in severity)  (Exception: Chest pains that last only a few seconds.)  Answer Assessment - Initial Assessment Questions For about 2 weeks she has had rigth breast pain that comes and goes. Described as sharp shooting with occasional chest heaviness. Does endorse right arm paresthesias intermittently.  Denies SOB, dizziness, fever, rash, lump to the breast  or nipple discharge. Due to heaviness and unilateral paresthesias- ED advised to rule out cardiac or CVA causation. Will call back for HFU after evaluation  Patient is reluctant to go- asked if she could wait until Monday- advised would need to be seen ASAP  1. SYMPTOM: What's the main symptom you're concerned about?  (e.g., lump, nipple discharge, pain, rash)     Right breast pain: 6-7/10 -- sharp pain  2. LOCATION: Where is the breast pain  located?     On the right breast  3. ONSET: When did pain  start?     2  weeks  4. PRIOR HISTORY: Do you have any history of prior problems with your breasts? (e.g., breast cancer, breast implant, fibrocystic breast disease)     Denies  5. CAUSE: What do you think is causing this symptom?     unsure 6. OTHER SYMPTOMS: Do you have any other symptoms? (e.g., breast pain, fever, nipple discharge, redness or rash)     Denies fever, discharge, lump rash.  Answer Assessment - Initial Assessment Questions 1. LOCATION: Where does it hurt?       Right chest heaviness 2. RADIATION: Does the pain go anywhere else? (e.g., into neck, jaw, arms, back)     Right arm paresthesias 3. ONSET: When did the chest pain begin? (Minutes, hours or days)      2 weeks off and on 4. PATTERN: Does the pain come and go, or has it been constant since it started?  Does it get worse with exertion?      Comes and goes- worse with activity 5. DURATION: How long does it last (e.g., seconds, minutes, hours)     unsure 6. SEVERITY: How bad is the pain?  (e.g., Scale 1-10; mild, moderate, or severe)     6-7/10 7. CARDIAC RISK FACTORS: Do you have any history of heart problems or risk factors for heart disease? (e.g., angina, prior heart attack; diabetes, high blood pressure, high cholesterol, smoker, or strong family history of heart disease)     denies 8. PULMONARY RISK FACTORS: Do you have any history of lung disease?  (e.g., blood clots in lung, asthma, emphysema, birth control pills)     denies 9. CAUSE: What  do you think is causing the chest pain?     unsure 10. OTHER SYMPTOMS: Do you have any other symptoms? (e.g., dizziness, nausea, vomiting, sweating, fever, difficulty breathing, cough)       Right breast pain  Protocols used: Breast Symptoms-A-AH, Chest Pain-A-AH
